# Patient Record
Sex: Female | Born: 2000
Health system: Southern US, Community
[De-identification: ages and names within clinical notes are randomized; demographics above are authoritative.]

## PROBLEM LIST (undated history)

## (undated) DIAGNOSIS — F419 Anxiety disorder, unspecified: Secondary | ICD-10-CM

## (undated) DIAGNOSIS — F909 Attention-deficit hyperactivity disorder, unspecified type: Secondary | ICD-10-CM

## (undated) DIAGNOSIS — F32A Depression, unspecified: Secondary | ICD-10-CM

## (undated) DIAGNOSIS — F329 Major depressive disorder, single episode, unspecified: Secondary | ICD-10-CM

## (undated) HISTORY — DX: Attention-deficit hyperactivity disorder, unspecified type: F90.9

## (undated) HISTORY — DX: Anxiety disorder, unspecified: F41.9

## (undated) HISTORY — DX: Major depressive disorder, single episode, unspecified: F32.9

## (undated) HISTORY — DX: Depression, unspecified: F32.A

---
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
## 2020-01-24 DIAGNOSIS — F411 Generalized anxiety disorder: Secondary | ICD-10-CM | POA: Diagnosis not present

## 2020-01-24 DIAGNOSIS — R69 Illness, unspecified: Secondary | ICD-10-CM | POA: Diagnosis not present

## 2020-01-24 DIAGNOSIS — F5105 Insomnia due to other mental disorder: Secondary | ICD-10-CM | POA: Diagnosis not present

## 2020-01-24 DIAGNOSIS — F429 Obsessive-compulsive disorder, unspecified: Secondary | ICD-10-CM | POA: Diagnosis not present

## 2020-01-25 DIAGNOSIS — I471 Supraventricular tachycardia: Secondary | ICD-10-CM | POA: Diagnosis not present

## 2020-01-25 DIAGNOSIS — I470 Re-entry ventricular arrhythmia: Secondary | ICD-10-CM | POA: Diagnosis not present

## 2020-01-25 DIAGNOSIS — I493 Ventricular premature depolarization: Secondary | ICD-10-CM | POA: Diagnosis not present

## 2020-01-25 DIAGNOSIS — I472 Ventricular tachycardia: Secondary | ICD-10-CM | POA: Diagnosis not present

## 2020-01-25 DIAGNOSIS — Q248 Other specified congenital malformations of heart: Secondary | ICD-10-CM | POA: Diagnosis not present

## 2020-02-06 DIAGNOSIS — I470 Re-entry ventricular arrhythmia: Secondary | ICD-10-CM | POA: Diagnosis not present

## 2020-02-12 DIAGNOSIS — I471 Supraventricular tachycardia: Secondary | ICD-10-CM | POA: Diagnosis not present

## 2020-02-12 DIAGNOSIS — I472 Ventricular tachycardia: Secondary | ICD-10-CM | POA: Diagnosis not present

## 2020-02-12 DIAGNOSIS — I470 Re-entry ventricular arrhythmia: Secondary | ICD-10-CM | POA: Diagnosis not present

## 2020-04-02 DIAGNOSIS — F411 Generalized anxiety disorder: Secondary | ICD-10-CM | POA: Diagnosis not present

## 2020-04-02 DIAGNOSIS — F429 Obsessive-compulsive disorder, unspecified: Secondary | ICD-10-CM | POA: Diagnosis not present

## 2020-04-02 DIAGNOSIS — F5105 Insomnia due to other mental disorder: Secondary | ICD-10-CM | POA: Diagnosis not present

## 2020-04-02 DIAGNOSIS — R69 Illness, unspecified: Secondary | ICD-10-CM | POA: Diagnosis not present

## 2020-04-09 DIAGNOSIS — Q248 Other specified congenital malformations of heart: Secondary | ICD-10-CM | POA: Diagnosis not present

## 2020-04-12 DIAGNOSIS — J029 Acute pharyngitis, unspecified: Secondary | ICD-10-CM | POA: Diagnosis not present

## 2020-04-16 DIAGNOSIS — R002 Palpitations: Secondary | ICD-10-CM | POA: Diagnosis not present

## 2020-04-16 DIAGNOSIS — I059 Rheumatic mitral valve disease, unspecified: Secondary | ICD-10-CM | POA: Diagnosis not present

## 2020-04-16 DIAGNOSIS — I493 Ventricular premature depolarization: Secondary | ICD-10-CM | POA: Diagnosis not present

## 2020-04-16 DIAGNOSIS — Q248 Other specified congenital malformations of heart: Secondary | ICD-10-CM | POA: Diagnosis not present

## 2020-04-16 DIAGNOSIS — I471 Supraventricular tachycardia: Secondary | ICD-10-CM | POA: Diagnosis not present

## 2020-04-16 DIAGNOSIS — R69 Illness, unspecified: Secondary | ICD-10-CM | POA: Diagnosis not present

## 2020-04-16 DIAGNOSIS — I499 Cardiac arrhythmia, unspecified: Secondary | ICD-10-CM | POA: Diagnosis not present

## 2020-04-16 DIAGNOSIS — I491 Atrial premature depolarization: Secondary | ICD-10-CM | POA: Diagnosis not present

## 2020-04-16 DIAGNOSIS — I472 Ventricular tachycardia: Secondary | ICD-10-CM | POA: Diagnosis not present

## 2020-04-17 DIAGNOSIS — J029 Acute pharyngitis, unspecified: Secondary | ICD-10-CM | POA: Diagnosis not present

## 2020-04-17 DIAGNOSIS — Z113 Encounter for screening for infections with a predominantly sexual mode of transmission: Secondary | ICD-10-CM | POA: Diagnosis not present

## 2020-05-26 DIAGNOSIS — J029 Acute pharyngitis, unspecified: Secondary | ICD-10-CM | POA: Diagnosis not present

## 2020-05-28 ENCOUNTER — Ambulatory Visit: Payer: Self-pay

## 2020-05-30 DIAGNOSIS — F411 Generalized anxiety disorder: Secondary | ICD-10-CM | POA: Diagnosis not present

## 2020-05-30 DIAGNOSIS — R69 Illness, unspecified: Secondary | ICD-10-CM | POA: Diagnosis not present

## 2020-05-30 DIAGNOSIS — F5105 Insomnia due to other mental disorder: Secondary | ICD-10-CM | POA: Diagnosis not present

## 2020-05-30 DIAGNOSIS — F429 Obsessive-compulsive disorder, unspecified: Secondary | ICD-10-CM | POA: Diagnosis not present

## 2020-06-15 ENCOUNTER — Encounter: Payer: Self-pay | Admitting: Emergency Medicine

## 2020-06-15 ENCOUNTER — Other Ambulatory Visit: Payer: Self-pay

## 2020-06-15 ENCOUNTER — Emergency Department
Admission: EM | Admit: 2020-06-15 | Discharge: 2020-06-15 | Disposition: A | Discharge status: Home / Self Care | Payer: 59 | Attending: Emergency Medicine | Admitting: Emergency Medicine

## 2020-06-15 DIAGNOSIS — R638 Other symptoms and signs concerning food and fluid intake: Secondary | ICD-10-CM | POA: Diagnosis not present

## 2020-06-15 DIAGNOSIS — Z3202 Encounter for pregnancy test, result negative: Secondary | ICD-10-CM | POA: Diagnosis not present

## 2020-06-15 DIAGNOSIS — R110 Nausea: Secondary | ICD-10-CM | POA: Diagnosis not present

## 2020-06-15 DIAGNOSIS — Z3200 Encounter for pregnancy test, result unknown: Secondary | ICD-10-CM

## 2020-06-15 LAB — POC URINE PREG, ED: Preg Test, Ur: NEGATIVE

## 2020-06-15 LAB — HCG, QUANTITATIVE, PREGNANCY: hCG, Beta Chain, Quant, S: <1 m[IU]/mL (ref <5)

## 2020-06-15 NOTE — ED Provider Notes (Signed)
[facility] Emergency Department Provider Note  ____________________________________________   Event Date/Time   First MD Initiated Contact with Patient 06/15/20 1020     (approximate)  I have reviewed the triage vital signs and the nursing notes.   HISTORY  Chief Complaint Possible Pregnancy    HPI Sherry Clayton is a 20 y.o. female presents emergency department with concerns of pregnancy.  Patient states she is about 10 days late for her period.   States her breasts are sore, she is nauseated, and she has had increased hunger.  No abdominal pain.  Patient took a home pregnancy test that was negative.  States that she is urinated more than twice this morning.   Past Medical History:  Diagnosis Date  . ADHD (attention deficit hyperactivity disorder)   . Anxiety   . Depression     Patient Active Problem List   Diagnosis Date Noted  . Mood disorder (HCC) 01/19/2019  . GAD (generalized anxiety disorder) 06/30/2018    History reviewed. No pertinent surgical history.  Prior to Admission medications   Medication Sig Start Date End Date Taking? Authorizing Provider  FLUoxetine (PROZAC) 10 MG capsule Take 1 capsule (10 mg total) by mouth daily. Take with Prozac 40 mg capsule to make total daily dose of Prozac 50 mg once a day. 03/09/19   Darcel Smalling, MD  FLUoxetine (PROZAC) 40 MG capsule TAKE 1 CAPSULE(40 MG) BY MOUTH DAILY 05/10/19   Darcel Smalling, MD  hydrOXYzine (ATARAX/VISTARIL) 10 MG tablet Take 1-2 tablets (10-20 mg total) by mouth 3 (three) times daily as needed for anxiety. 03/09/19   Darcel Smalling, MD  QUEtiapine (SEROQUEL) 25 MG tablet Take 2 tablets (50 mg total) by mouth at bedtime. 03/09/19   Darcel Smalling, MD    Allergies Sulfa antibiotics  Family History  Problem Relation Age of Onset  . Anxiety disorder Mother   . Depression Mother     Social History Social History   Tobacco Use  . Smoking status: Never Smoker  .  Smokeless tobacco: Never Used  Vaping Use  . Vaping Use: Never used  Substance Use Topics  . Alcohol use: No  . Drug use: No    Review of Systems  Constitutional: No fever/chills Eyes: No visual changes. ENT: No sore throat. Respiratory: Denies cough Cardiovascular: Denies chest pain Gastrointestinal: Denies abdominal pain Genitourinary: Negative for dysuria. Musculoskeletal: Negative for back pain. Skin: Negative for rash. Psychiatric: no mood changes,     ____________________________________________   PHYSICAL EXAM:  VITAL SIGNS: ED Triage Vitals  Enc Vitals Group     BP 06/15/20 1012 115/69     Pulse Rate 06/15/20 1012 91     Resp 06/15/20 1012 20     Temp 06/15/20 1012 98.3 F (36.8 C)     Temp Source 06/15/20 1012 Oral     SpO2 06/15/20 1012 100 %     Weight 06/15/20 1011 130 lb (59 kg)     Height 06/15/20 1011 5\' 7"  (1.702 m)     Head Circumference --      Peak Flow --      Pain Score 06/15/20 1011 0     Pain Loc --      Pain Edu? --      Excl. in GC? --     Constitutional: Alert and oriented. Well appearing and in no acute distress. Eyes: Conjunctivae are normal.  Head: Atraumatic. Nose: No congestion/rhinnorhea. Mouth/Throat: Mucous membranes are moist.  Neck:  supple no lymphadenopathy noted Cardiovascular: Normal rate, regular rhythm. Heart sounds are normal Respiratory: Normal respiratory effort.  No retractions, lungs c t a  Abd: soft nontender bs normal all 4 quad GU: deferred Musculoskeletal: FROM all extremities, warm and well perfused Neurologic:  Normal speech and language.  Skin:  Skin is warm, dry and intact. No rash noted. Psychiatric: Mood and affect are normal. Speech and behavior are normal.  ____________________________________________   LABS (all labs ordered are listed, but only abnormal results are displayed)  Labs Reviewed  HCG, QUANTITATIVE, PREGNANCY  POC URINE PREG, ED    ____________________________________________   ____________________________________________  RADIOLOGY    ____________________________________________   PROCEDURES  Procedure(s) performed: No  Procedures    ____________________________________________   INITIAL IMPRESSION / ASSESSMENT AND PLAN / ED COURSE  Pertinent labs & imaging results that were available during my care of the patient were reviewed by me and considered in my medical decision making (see chart for details).   Patient is 20 year old female presents emergency department with concerns of pregnancy.  See HPI.  Physical exam shows patient appears stable.  POC pregnancy is negative Beta hCG  Explained to the patient I can call her with results.  Since she is not having any abdominal pain will not need to do further testing.  She is agreeable to this plan.    ----------------------------------------- 11:43 AM on 06/15/2020 -----------------------------------------  Beta-hCG is less than 1.  I did call the patient to notify her of the results.  She is to follow-up with her regular doctor if not improving to 3 days.  Return if worsening.  She discharged stable condition.  Sherry Clayton was evaluated in Emergency Department on 06/15/2020 for the symptoms described in the history of present illness. She was evaluated in the context of the global COVID-19 pandemic, which necessitated consideration that the patient might be at risk for infection with the SARS-CoV-2 virus that causes COVID-19. Institutional protocols and algorithms that pertain to the evaluation of patients at risk for COVID-19 are in a state of rapid change based on information released by regulatory bodies including the CDC and federal and state organizations. These policies and algorithms were followed during the patient's care in the ED.    As part of my medical decision making, I reviewed the following data within the electronic MEDICAL RECORD NUMBER  Nursing notes reviewed and incorporated, Labs reviewed , Old chart reviewed, Notes from prior ED visits and [place] Controlled Substance Database  ____________________________________________   FINAL CLINICAL IMPRESSION(S) / ED DIAGNOSES  Final diagnoses:  Encounter for pregnancy test, result unknown  Negative pregnancy test      NEW MEDICATIONS STARTED DURING THIS VISIT:  Discharge Medication List as of 06/15/2020 10:46 AM       Note:  This document was prepared using Dragon voice recognition software and may include unintentional dictation errors.    Faythe Ghee, PA-C 06/15/20 1143    Minna Antis, MD 06/15/20 [phone]

## 2020-06-15 NOTE — ED Notes (Signed)
Pt concerned that she is pregnant. Pt took home preg test yesterday that was negative, but states her period is a few days late. Pt is on birth control. Pt states having some nausea and increased hunger and minor breast tenderness.

## 2020-06-15 NOTE — ED Triage Notes (Signed)
Pt reports wants a pregnancy test

## 2020-07-15 DIAGNOSIS — R69 Illness, unspecified: Secondary | ICD-10-CM | POA: Diagnosis not present

## 2020-07-22 DIAGNOSIS — R69 Illness, unspecified: Secondary | ICD-10-CM | POA: Diagnosis not present

## 2020-07-29 DIAGNOSIS — R69 Illness, unspecified: Secondary | ICD-10-CM | POA: Diagnosis not present

## 2020-07-29 DIAGNOSIS — F5105 Insomnia due to other mental disorder: Secondary | ICD-10-CM | POA: Diagnosis not present

## 2020-07-29 DIAGNOSIS — F411 Generalized anxiety disorder: Secondary | ICD-10-CM | POA: Diagnosis not present

## 2020-07-29 DIAGNOSIS — F429 Obsessive-compulsive disorder, unspecified: Secondary | ICD-10-CM | POA: Diagnosis not present

## 2020-08-05 DIAGNOSIS — R69 Illness, unspecified: Secondary | ICD-10-CM | POA: Diagnosis not present

## 2020-08-14 DIAGNOSIS — L03114 Cellulitis of left upper limb: Secondary | ICD-10-CM | POA: Diagnosis not present

## 2020-08-14 DIAGNOSIS — R69 Illness, unspecified: Secondary | ICD-10-CM | POA: Diagnosis not present

## 2020-08-19 DIAGNOSIS — F411 Generalized anxiety disorder: Secondary | ICD-10-CM | POA: Diagnosis not present

## 2020-08-19 DIAGNOSIS — R69 Illness, unspecified: Secondary | ICD-10-CM | POA: Diagnosis not present

## 2020-08-19 DIAGNOSIS — F5105 Insomnia due to other mental disorder: Secondary | ICD-10-CM | POA: Diagnosis not present

## 2020-08-19 DIAGNOSIS — F429 Obsessive-compulsive disorder, unspecified: Secondary | ICD-10-CM | POA: Diagnosis not present

## 2020-08-24 DIAGNOSIS — R300 Dysuria: Secondary | ICD-10-CM | POA: Diagnosis not present

## 2020-08-24 DIAGNOSIS — R1084 Generalized abdominal pain: Secondary | ICD-10-CM | POA: Diagnosis not present

## 2020-08-24 DIAGNOSIS — R69 Illness, unspecified: Secondary | ICD-10-CM | POA: Diagnosis not present

## 2020-08-29 DIAGNOSIS — R69 Illness, unspecified: Secondary | ICD-10-CM | POA: Diagnosis not present

## 2020-08-30 ENCOUNTER — Encounter: Payer: Self-pay | Admitting: Physician Assistant

## 2020-08-30 ENCOUNTER — Other Ambulatory Visit: Payer: Self-pay

## 2020-08-30 ENCOUNTER — Ambulatory Visit: Payer: 59 | Admitting: Physician Assistant

## 2020-08-30 DIAGNOSIS — Z113 Encounter for screening for infections with a predominantly sexual mode of transmission: Secondary | ICD-10-CM

## 2020-08-30 NOTE — Progress Notes (Signed)
Patient into clinic for STD IS.  Patient is currently taking antibiotic for an UTI and has 5 more days left on her course.  Counseled patient that there is a possibility that due to her current antibiotic use her test results for GC,Chlamydia and Syphilis may not be accurate.  Offered patient option of proceeding with visit and testing today.  Patient opted to not proceed with her visit today and stated that she will RTC after completing her antibiotic for testing.

## 2020-09-17 DIAGNOSIS — F5105 Insomnia due to other mental disorder: Secondary | ICD-10-CM | POA: Diagnosis not present

## 2020-09-17 DIAGNOSIS — F411 Generalized anxiety disorder: Secondary | ICD-10-CM | POA: Diagnosis not present

## 2020-09-17 DIAGNOSIS — F429 Obsessive-compulsive disorder, unspecified: Secondary | ICD-10-CM | POA: Diagnosis not present

## 2020-09-17 DIAGNOSIS — R69 Illness, unspecified: Secondary | ICD-10-CM | POA: Diagnosis not present

## 2020-09-19 DIAGNOSIS — R69 Illness, unspecified: Secondary | ICD-10-CM | POA: Diagnosis not present

## 2020-09-23 DIAGNOSIS — R69 Illness, unspecified: Secondary | ICD-10-CM | POA: Diagnosis not present

## 2020-09-30 DIAGNOSIS — R69 Illness, unspecified: Secondary | ICD-10-CM | POA: Diagnosis not present

## 2020-10-03 ENCOUNTER — Ambulatory Visit: Payer: Self-pay | Admitting: Physician Assistant

## 2020-10-03 ENCOUNTER — Encounter: Payer: Self-pay | Admitting: Physician Assistant

## 2020-10-03 ENCOUNTER — Other Ambulatory Visit: Payer: Self-pay

## 2020-10-03 DIAGNOSIS — R21 Rash and other nonspecific skin eruption: Secondary | ICD-10-CM

## 2020-10-03 MED ORDER — HYDROXYZINE PAMOATE 25 MG PO CAPS: 25.0000 mg | ORAL_CAPSULE | Freq: Three times a day (TID) | ORAL | 0 refills | Status: DC | PRN

## 2020-10-03 MED ORDER — HYDROCORTISONE VALERATE 0.2 % EX CREA: 1.0000 "application " | TOPICAL_CREAM | Freq: Two times a day (BID) | CUTANEOUS | 0 refills | Status: DC

## 2020-10-03 MED ORDER — METHYLPREDNISOLONE 4 MG PO TBPK: ORAL_TABLET | ORAL | 0 refills | Status: DC

## 2020-10-03 NOTE — Progress Notes (Signed)
Pt has rash/red bumps on both legs started Monday. Spreading to arms. Pt describes this very itchy. Denies being in woods and not allergic to anything. CL,RMA

## 2020-10-03 NOTE — Progress Notes (Signed)
   Subjective: Rash    Patient ID: Sherry Clayton, female    DOB: 2000-08-09, 20 y.o.   MRN: [id number]  HPI Patient presents for rash to bilateral lower extremity for 3 days.  Patient denies contact via outdoor activities, patient denies new personal hygiene or laundry products.  Patient's itching associated with rash.  No palliative measure for complaint.  Patient also requests STD testing.  Patient denies symptoms.  Last sexual contact 6 days ago.   Review of Systems Anxiety    Objective:   Physical Exam  No acute distress.  Multiple papular lesion bilateral lower extremity in different stages of healing.  The foot is spared.  The thighs are spared.  The area seem to be concentrated mostly where the patient shaves.  Genitourinary system deferred.      Assessment & Plan: Rash   Patient advised decrease shaving area for 7 to 10 days.  Patient given Solu-Medrol 80 mg IM followed by a prescription of Medrol Dosepak, hydrocortisone, and Atarax.  Patient will follow-up in 1 week.

## 2020-10-04 ENCOUNTER — Other Ambulatory Visit: Payer: Self-pay

## 2020-10-04 ENCOUNTER — Ambulatory Visit: Payer: 59 | Admitting: Advanced Practice Midwife

## 2020-10-04 DIAGNOSIS — Z0184 Encounter for antibody response examination: Secondary | ICD-10-CM

## 2020-10-04 NOTE — Progress Notes (Signed)
Pt presents today for labs from yesterdays appointment with ron smith,pa. Pt declined blood work after requesting due to getting dizzy after steroid injections in the hip. Pt called back today to receive her labs. CL,RMA

## 2020-10-07 LAB — HIV ANTIBODY (ROUTINE TESTING W REFLEX): HIV Screen 4th Generation wRfx: NONREACTIVE

## 2020-10-07 LAB — VARICELLA ZOSTER ANTIBODY, IGG: Varicella zoster IgG: <135 index — ABNORMAL LOW (ref >165)

## 2020-10-07 MED ORDER — METHYLPREDNISOLONE SODIUM SUCC 40 MG IJ SOLR
80.0000 mg | Freq: Once | INTRAMUSCULAR | Status: AC
Start: 2020-10-07 — End: 2020-10-03
  Administered 2020-10-03: 80 mg via INTRAMUSCULAR

## 2020-10-07 NOTE — Addendum Note (Signed)
Addended by: Christianne Dolin F on: 10/07/2020 09:13 AM   Modules accepted: Orders

## 2020-10-16 DIAGNOSIS — R69 Illness, unspecified: Secondary | ICD-10-CM | POA: Diagnosis not present

## 2020-10-18 DIAGNOSIS — Z709 Sex counseling, unspecified: Secondary | ICD-10-CM | POA: Diagnosis not present

## 2020-10-18 DIAGNOSIS — R69 Illness, unspecified: Secondary | ICD-10-CM | POA: Diagnosis not present

## 2020-10-22 DIAGNOSIS — R69 Illness, unspecified: Secondary | ICD-10-CM | POA: Diagnosis not present

## 2020-10-23 DIAGNOSIS — R69 Illness, unspecified: Secondary | ICD-10-CM | POA: Diagnosis not present

## 2020-10-28 ENCOUNTER — Other Ambulatory Visit: Payer: Self-pay

## 2020-10-28 ENCOUNTER — Encounter: Payer: Self-pay | Admitting: Physician Assistant

## 2020-10-28 ENCOUNTER — Ambulatory Visit: Payer: Self-pay | Admitting: Physician Assistant

## 2020-10-28 VITALS — BP 108/69 | HR 92 | Temp 98.4°F | Resp 12 | Ht 67.0 in | Wt 122.0 lb

## 2020-10-28 DIAGNOSIS — R21 Rash and other nonspecific skin eruption: Secondary | ICD-10-CM

## 2020-10-28 NOTE — Progress Notes (Signed)
Pt presents today with rash on both legs. Pt states most of it went a way when treated now its coming back. CL,RMA

## 2020-10-28 NOTE — Progress Notes (Signed)
   Subjective:Rash    Patient ID: Sherry Clayton, female    DOB: 2001-02-27, 20 y.o.   MRN: [id number]  HPI Patient to follow-up with 3 weeks secondary to nonspecific rash to bilateral lower extremities.  Patient was given prescription for Medrol Dosepak and Atarax.  Patient states the rash improved but started coming back status post cessation of medications.  Patient also has restarted shaving area but stated the rash came before she started shaving.  States mild itching but no drainage.   Review of Systems Negative except for complaint    Objective:   Physical Exam No acute distress.  Papular lesion bilateral lower extremity.  No signs symptoms of excoriation or secondary infection.      Assessment & Plan: Nonspecific rash   Differentials consist of contact dermatitis versus irritation from shaving.  Definitive evaluation by dermatology is warranted.  Consult will be generated patient with follow-up with dermatology.

## 2020-10-31 ENCOUNTER — Ambulatory Visit (INDEPENDENT_AMBULATORY_CARE_PROVIDER_SITE_OTHER): Payer: 59 | Admitting: Obstetrics

## 2020-10-31 ENCOUNTER — Other Ambulatory Visit: Payer: Self-pay

## 2020-10-31 ENCOUNTER — Encounter: Payer: Self-pay | Admitting: Obstetrics

## 2020-10-31 ENCOUNTER — Other Ambulatory Visit (HOSPITAL_COMMUNITY)
Admission: RE | Admit: 2020-10-31 | Discharge: 2020-10-31 | Disposition: A | Discharge status: Home / Self Care | Payer: 59 | Source: Ambulatory Visit | Attending: Obstetrics | Admitting: Obstetrics

## 2020-10-31 VITALS — BP 120/80 | Ht 67.0 in | Wt 125.0 lb

## 2020-10-31 DIAGNOSIS — R69 Illness, unspecified: Secondary | ICD-10-CM | POA: Diagnosis not present

## 2020-10-31 DIAGNOSIS — N898 Other specified noninflammatory disorders of vagina: Secondary | ICD-10-CM | POA: Diagnosis not present

## 2020-10-31 DIAGNOSIS — Z113 Encounter for screening for infections with a predominantly sexual mode of transmission: Secondary | ICD-10-CM

## 2020-10-31 NOTE — Progress Notes (Signed)
Obstetrics & Gynecology Office Visit   Chief Complaint:  Chief Complaint  Patient presents with   Vaginitis    History of Present Illness: Sherry Clayton is a 20 year old patient, new to the practice, who presents for c/o a vaginal odor that she has noticed for about two weeks. She was seen last week at her pediatrics office and shares that she was screened for STIs, and "everything was negative". She had both vaginal swab and blood work at that office. Today she reports with the same complaint of a vaginal discharge that has an odor. She is sexually active, and uses condoms for birth control. She admits to greater than 10 sexual partners over the last year; two partners in the last month. She is not interested in using other contraceptives.   Review of Systems:  Review of Systems  Constitutional: Negative.   HENT: Negative.    Eyes: Negative.   Respiratory: Negative.    Cardiovascular: Negative.   Gastrointestinal: Negative.   Genitourinary: Negative.   Musculoskeletal: Negative.   Skin: Negative.   Neurological: Negative.   Endo/Heme/Allergies: Negative.   Psychiatric/Behavioral:  The patient is nervous/anxious.        Hx of anxiety    Past Medical History:  Past Medical History:  Diagnosis Date   ADHD (attention deficit hyperactivity disorder)    Anxiety    Depression     Past Surgical History:  History reviewed. No pertinent surgical history.  Gynecologic History: Patient's last menstrual period was 10/14/2020.  Obstetric History: No obstetric history on file.  Family History:  Family History  Problem Relation Age of Onset   Anxiety disorder Mother    Depression Mother     Social History:  Social History   Socioeconomic History   Marital status: Single    Spouse name: Not on file   Number of children: Not on file   Years of education: Not on file   Highest education level: Not on file  Occupational History   Not on file  Tobacco Use   Smoking status: Never    Smokeless tobacco: Never  Vaping Use   Vaping Use: Never used  Substance and Sexual Activity   Alcohol use: No   Drug use: No   Sexual activity: Yes  Other Topics Concern   Not on file  Social History Narrative   Not on file   Social Determinants of Health   Financial Resource Strain: Not on file  Food Insecurity: Not on file  Transportation Needs: Not on file  Physical Activity: Not on file  Stress: Not on file  Social Connections: Not on file  Intimate Partner Violence: Not on file    Allergies:  Allergies  Allergen Reactions   Sulfa Antibiotics Rash    Medications: Prior to Admission medications   Medication Sig Start Date End Date Taking? Authorizing Provider  FLUoxetine (PROZAC) 20 MG capsule Take 20 mg by mouth daily. 09/23/20   [provider]  hydrocortisone valerate cream (WESTCORT) 0.2 % Apply 1 application topically 2 (two) times daily. 10/03/20   Joni Reining, PA-C  hydrOXYzine (VISTARIL) 25 MG capsule Take 1 capsule (25 mg total) by mouth every 8 (eight) hours as needed. 10/03/20   Joni Reining, PA-C  lamoTRIgine (LAMICTAL) 150 MG tablet  11/19/19   [provider]  QUEtiapine (SEROQUEL) 25 MG tablet Take 2 tablets (50 mg total) by mouth at bedtime. 03/09/19   Darcel Smalling, MD    Physical Exam  Vitals:  Vitals:   10/31/20 1337  BP: 120/80   Patient's last menstrual period was 10/14/2020.  Physical Exam Vitals reviewed.  Constitutional:      Appearance: Normal appearance. She is normal weight.  HENT:     Head: Normocephalic and atraumatic.     Nose: Nose normal.  Cardiovascular:     Rate and Rhythm: Normal rate and regular rhythm.     Pulses: Normal pulses.     Heart sounds: Normal heart sounds.  Pulmonary:     Effort: Pulmonary effort is normal.     Breath sounds: Normal breath sounds.  Abdominal:     General: Abdomen is flat.     Palpations: Abdomen is soft.  Genitourinary:    General: Normal vulva.     Vagina:  Vaginal discharge present.     Rectum: Normal.     Comments: Shaves entire mons area. No lesions or rashes noted. Normal vaginal rugae. Scant whitish discharge. Normal appearing cervix. Aptima swab retrieved. Wet mount shows moderate WBCs, negative whiff, no trich or yeast seen. Musculoskeletal:        General: Normal range of motion.     Cervical back: Normal range of motion and neck supple.  Skin:    General: Skin is warm and dry.  Neurological:     General: No focal deficit present.     Mental Status: She is alert and oriented to person, place, and time.  Psychiatric:        Mood and Affect: Mood normal.        Behavior: Behavior normal.     Assessment: 20 y.o. No obstetric history on file. Vaginal discharge.   Plan: Problem List Items Addressed This Visit       Other   Vaginal discharge - Primary   Relevant Orders   Cervicovaginal ancillary only   Other Visit Diagnoses     Routine screening for STI (sexually transmitted infection)       Relevant Orders   Cervicovaginal ancillary only     Will contact patient regarding the test results. Discussed risk for STIs with her number of partners. Praised for condom use. Offered OCPs or other contraception- encouraged her to RTC for contraception.  Mirna Mires, CNM  10/31/2020 5:45 PM

## 2020-11-04 LAB — CERVICOVAGINAL ANCILLARY ONLY
Bacterial Vaginitis (gardnerella): POSITIVE — AB
Candida Glabrata: NEGATIVE
Candida Vaginitis: NEGATIVE
Chlamydia: NEGATIVE
Comment: NEGATIVE
Comment: NEGATIVE
Comment: NEGATIVE
Comment: NEGATIVE
Comment: NEGATIVE
Comment: NORMAL
Neisseria Gonorrhea: NEGATIVE
Trichomonas: NEGATIVE

## 2020-11-08 ENCOUNTER — Other Ambulatory Visit: Payer: Self-pay | Admitting: Obstetrics

## 2020-11-08 DIAGNOSIS — B9689 Other specified bacterial agents as the cause of diseases classified elsewhere: Secondary | ICD-10-CM

## 2020-11-08 MED ORDER — METRONIDAZOLE 500 MG PO TABS: 500.0000 mg | ORAL_TABLET | Freq: Two times a day (BID) | ORAL | 0 refills | Status: AC

## 2020-11-08 NOTE — Progress Notes (Signed)
Patient notified of the +BV diagosisi. Rx for Metronidazole sent to pharmacy/ Mirna Mires, CNM  11/08/2020 9:37 AM

## 2020-12-06 DIAGNOSIS — F411 Generalized anxiety disorder: Secondary | ICD-10-CM | POA: Diagnosis not present

## 2020-12-06 DIAGNOSIS — F429 Obsessive-compulsive disorder, unspecified: Secondary | ICD-10-CM | POA: Diagnosis not present

## 2020-12-06 DIAGNOSIS — R69 Illness, unspecified: Secondary | ICD-10-CM | POA: Diagnosis not present

## 2020-12-06 DIAGNOSIS — F5105 Insomnia due to other mental disorder: Secondary | ICD-10-CM | POA: Diagnosis not present

## 2020-12-22 DIAGNOSIS — F429 Obsessive-compulsive disorder, unspecified: Secondary | ICD-10-CM | POA: Diagnosis not present

## 2020-12-22 DIAGNOSIS — R69 Illness, unspecified: Secondary | ICD-10-CM | POA: Diagnosis not present

## 2020-12-22 DIAGNOSIS — F411 Generalized anxiety disorder: Secondary | ICD-10-CM | POA: Diagnosis not present

## 2020-12-22 DIAGNOSIS — F5105 Insomnia due to other mental disorder: Secondary | ICD-10-CM | POA: Diagnosis not present

## 2021-01-07 DIAGNOSIS — R69 Illness, unspecified: Secondary | ICD-10-CM | POA: Diagnosis not present

## 2021-01-14 DIAGNOSIS — F5105 Insomnia due to other mental disorder: Secondary | ICD-10-CM | POA: Diagnosis not present

## 2021-01-14 DIAGNOSIS — F411 Generalized anxiety disorder: Secondary | ICD-10-CM | POA: Diagnosis not present

## 2021-01-14 DIAGNOSIS — F429 Obsessive-compulsive disorder, unspecified: Secondary | ICD-10-CM | POA: Diagnosis not present

## 2021-01-14 DIAGNOSIS — R69 Illness, unspecified: Secondary | ICD-10-CM | POA: Diagnosis not present

## 2021-01-17 DIAGNOSIS — R69 Illness, unspecified: Secondary | ICD-10-CM | POA: Diagnosis not present

## 2021-01-22 DIAGNOSIS — R69 Illness, unspecified: Secondary | ICD-10-CM | POA: Diagnosis not present

## 2021-01-31 DIAGNOSIS — R69 Illness, unspecified: Secondary | ICD-10-CM | POA: Diagnosis not present

## 2021-02-12 ENCOUNTER — Other Ambulatory Visit: Payer: Self-pay | Admitting: Physician Assistant

## 2021-02-12 ENCOUNTER — Encounter: Payer: Self-pay | Admitting: Physician Assistant

## 2021-02-12 ENCOUNTER — Other Ambulatory Visit: Payer: Self-pay

## 2021-02-12 ENCOUNTER — Ambulatory Visit: Payer: Self-pay | Admitting: Physician Assistant

## 2021-02-12 VITALS — BP 104/67 | HR 98 | Temp 98.2°F | Resp 12 | Ht 67.0 in | Wt 130.0 lb

## 2021-02-12 DIAGNOSIS — R399 Unspecified symptoms and signs involving the genitourinary system: Secondary | ICD-10-CM

## 2021-02-12 LAB — POCT URINALYSIS DIPSTICK
Bilirubin, UA: NEGATIVE
Blood, UA: NEGATIVE
Glucose, UA: NEGATIVE
Ketones, UA: NEGATIVE
Leukocytes, UA: NEGATIVE
Nitrite, UA: NEGATIVE
Protein, UA: NEGATIVE
Spec Grav, UA: <=1.005 — AB (ref 1.010–1.025)
Urobilinogen, UA: 0.2 E.U./dL
pH, UA: 6 (ref 5.0–8.0)

## 2021-02-12 MED ORDER — PHENAZOPYRIDINE HCL 200 MG PO TABS
200.0000 mg | ORAL_TABLET | Freq: Three times a day (TID) | ORAL | 0 refills | Status: DC | PRN
Start: 2021-02-12 — End: 2021-11-05

## 2021-02-12 NOTE — Progress Notes (Signed)
For about 2 or three mostly 3 weeks, Pt states she has burning sensation when voiding and slight itching. She says it comes and goes./CL,RMA

## 2021-02-12 NOTE — Progress Notes (Signed)
   Subjective:dysuria    Patient ID: Sherry Clayton, female    DOB: 2000/11/25, 20 y.o.   MRN: [id number]  HPI Patient presents with urinary frequency and dysuria.  Patient has vaginal discharge.  Patient denies fever or flank pain.  Patient is sexually active.   Review of Systems Anxiety, depression, and generalized seizure disorder.    Objective:   Physical Exam Deferred.  No acute findings on urinalysis.       Assessment & Plan:   Patient advised to follow-up with GYN in clinic.  Patient given prescription for Pyridium and advised her color changes have been noticed in the urine.

## 2021-02-13 DIAGNOSIS — M5489 Other dorsalgia: Secondary | ICD-10-CM | POA: Diagnosis not present

## 2021-02-13 DIAGNOSIS — R300 Dysuria: Secondary | ICD-10-CM | POA: Diagnosis not present

## 2021-03-18 DIAGNOSIS — F429 Obsessive-compulsive disorder, unspecified: Secondary | ICD-10-CM | POA: Diagnosis not present

## 2021-03-18 DIAGNOSIS — R69 Illness, unspecified: Secondary | ICD-10-CM | POA: Diagnosis not present

## 2021-03-18 DIAGNOSIS — F411 Generalized anxiety disorder: Secondary | ICD-10-CM | POA: Diagnosis not present

## 2021-03-18 DIAGNOSIS — F5105 Insomnia due to other mental disorder: Secondary | ICD-10-CM | POA: Diagnosis not present

## 2021-04-18 DIAGNOSIS — R69 Illness, unspecified: Secondary | ICD-10-CM | POA: Diagnosis not present

## 2021-04-28 DIAGNOSIS — R69 Illness, unspecified: Secondary | ICD-10-CM | POA: Diagnosis not present

## 2021-05-06 DIAGNOSIS — R69 Illness, unspecified: Secondary | ICD-10-CM | POA: Diagnosis not present

## 2021-05-26 DIAGNOSIS — R69 Illness, unspecified: Secondary | ICD-10-CM | POA: Diagnosis not present

## 2021-06-06 DIAGNOSIS — F5105 Insomnia due to other mental disorder: Secondary | ICD-10-CM | POA: Diagnosis not present

## 2021-06-06 DIAGNOSIS — F429 Obsessive-compulsive disorder, unspecified: Secondary | ICD-10-CM | POA: Diagnosis not present

## 2021-06-06 DIAGNOSIS — F411 Generalized anxiety disorder: Secondary | ICD-10-CM | POA: Diagnosis not present

## 2021-06-06 DIAGNOSIS — R69 Illness, unspecified: Secondary | ICD-10-CM | POA: Diagnosis not present

## 2021-06-16 DIAGNOSIS — R69 Illness, unspecified: Secondary | ICD-10-CM | POA: Diagnosis not present

## 2021-07-07 DIAGNOSIS — R69 Illness, unspecified: Secondary | ICD-10-CM | POA: Diagnosis not present

## 2021-07-22 DIAGNOSIS — N898 Other specified noninflammatory disorders of vagina: Secondary | ICD-10-CM | POA: Diagnosis not present

## 2021-07-28 DIAGNOSIS — R69 Illness, unspecified: Secondary | ICD-10-CM | POA: Diagnosis not present

## 2021-08-19 DIAGNOSIS — R69 Illness, unspecified: Secondary | ICD-10-CM | POA: Diagnosis not present

## 2021-09-02 DIAGNOSIS — F429 Obsessive-compulsive disorder, unspecified: Secondary | ICD-10-CM | POA: Diagnosis not present

## 2021-09-02 DIAGNOSIS — R69 Illness, unspecified: Secondary | ICD-10-CM | POA: Diagnosis not present

## 2021-09-02 DIAGNOSIS — F5105 Insomnia due to other mental disorder: Secondary | ICD-10-CM | POA: Diagnosis not present

## 2021-09-02 DIAGNOSIS — F411 Generalized anxiety disorder: Secondary | ICD-10-CM | POA: Diagnosis not present

## 2021-09-15 DIAGNOSIS — R69 Illness, unspecified: Secondary | ICD-10-CM | POA: Diagnosis not present

## 2021-10-20 DIAGNOSIS — R69 Illness, unspecified: Secondary | ICD-10-CM | POA: Diagnosis not present

## 2021-11-03 DIAGNOSIS — R69 Illness, unspecified: Secondary | ICD-10-CM | POA: Diagnosis not present

## 2021-11-05 ENCOUNTER — Ambulatory Visit: Payer: Self-pay | Admitting: Emergency Medicine

## 2021-11-05 VITALS — BP 123/81 | HR 105 | Temp 98.0°F | Resp 14 | Ht 68.0 in | Wt 147.0 lb

## 2021-11-05 DIAGNOSIS — F411 Generalized anxiety disorder: Secondary | ICD-10-CM

## 2021-11-05 DIAGNOSIS — R69 Illness, unspecified: Secondary | ICD-10-CM | POA: Diagnosis not present

## 2021-11-05 DIAGNOSIS — R002 Palpitations: Secondary | ICD-10-CM

## 2021-11-05 DIAGNOSIS — F429 Obsessive-compulsive disorder, unspecified: Secondary | ICD-10-CM | POA: Diagnosis not present

## 2021-11-05 DIAGNOSIS — F5105 Insomnia due to other mental disorder: Secondary | ICD-10-CM | POA: Diagnosis not present

## 2021-11-05 NOTE — Progress Notes (Signed)
  Subjective:     Patient ID: Sherry Clayton, female   DOB: Oct 04, 2000, 20 y.o.   MRN: [id number]  HPI Here with complaint of possible dips in her blood sugar, increased anxiety and some palpations.   Patient states that she is having difficulty sleeping some nights.  She continues to take her regular medication and also has an appointment with Dr. Maryruth Bun this afternoon at 3:30pm.  Patient has history of mood disorder, bipolar and anxiety.  Patient reports that she is still continuing to take medication prescribed by her psychiatrist.  She was being followed by cardiology at [facility] with her last visit being 04/16/2020.  She reports that she currently is trying to get established with a cardiologist with [facility] as [facility] is no longer in her network.  She denies any chest pain or difficulty breathing.  She reports that in the mornings she does not eat anything and only eats around 12 noon or 1 PM.  She states that eating sugar sometimes may make her feel better.  Patient admits to occasional alcohol use but denies the use of recreational drugs.  She has decreased and has almost discontinued vaping completely as she realized this could be a part of her anxiety.  Review of Systems No cough, fever, chills, vomiting or diarrhea.  No syncopal episodes.    Objective:   Physical Exam Patient is able to talk in complete sentences without any shortness of breath or difficulty.  Very active and animated. Heart regular rhythm and a rate of 97. Lungs are clear bilaterally.    Assessment:     Anxiety. Reported insomnia Bipolar disorder currently being seen by psychiatry.     Plan:     In looking back through patient's chart she has not had any lab work done in this office and she did is not aware of any blood work being done in any office in the last 2 years.  I suggested that we do an executive panel fasting tomorrow with a TSH and A1c.  Patient is willing and understands that this will be needed to further evaluate  some of her complaints.  She is strongly encouraged to keep her appointment with her psychiatrist this afternoon.  EKG in the office today shows normal sinus rhythm with a ventricular rate of 97.  Compared with last EKG from [facility] 04/16/20.  She is to continue taking medication as prescribed by her psychiatrist until she is advised otherwise.

## 2021-11-05 NOTE — Progress Notes (Signed)
Concerned of having low sugar causing anxiety to be worse or having panic attacks more often.

## 2021-11-06 ENCOUNTER — Other Ambulatory Visit: Payer: Self-pay

## 2021-11-06 DIAGNOSIS — Z0000 Encounter for general adult medical examination without abnormal findings: Secondary | ICD-10-CM

## 2021-11-06 LAB — POCT URINALYSIS DIPSTICK
Bilirubin, UA: NEGATIVE
Blood, UA: NEGATIVE
Glucose, UA: NEGATIVE
Ketones, UA: NEGATIVE
Leukocytes, UA: NEGATIVE
Nitrite, UA: NEGATIVE
Protein, UA: NEGATIVE
Spec Grav, UA: 1.025 (ref 1.010–1.025)
Urobilinogen, UA: 0.2 E.U./dL
pH, UA: 6 (ref 5.0–8.0)

## 2021-11-07 LAB — CMP12+LP+TP+TSH+6AC+CBC/D/PLT
ALT: 9 IU/L (ref 0–32)
AST: 14 IU/L (ref 0–40)
Albumin/Globulin Ratio: 1.7 (ref 1.2–2.2)
Albumin: 4.4 g/dL (ref 4.0–5.0)
Alkaline Phosphatase: 88 IU/L (ref 42–106)
BUN/Creatinine Ratio: 16 (ref 9–23)
BUN: 13 mg/dL (ref 6–20)
Basophils Absolute: 0.1 10*3/uL (ref 0.0–0.2)
Basos: 1 %
Bilirubin Total: <0.2 mg/dL (ref 0.0–1.2)
Calcium: 9.1 mg/dL (ref 8.7–10.2)
Chloride: 105 mmol/L (ref 96–106)
Chol/HDL Ratio: 3 ratio (ref 0.0–4.4)
Cholesterol, Total: 161 mg/dL (ref 100–199)
Creatinine, Ser: 0.79 mg/dL (ref 0.57–1.00)
EOS (ABSOLUTE): 0.1 10*3/uL (ref 0.0–0.4)
Eos: 2 %
Estimated CHD Risk: <0.5 times avg. (ref 0.0–1.0)
Free Thyroxine Index: 2.2 (ref 1.2–4.9)
GGT: 8 IU/L (ref 0–60)
Globulin, Total: 2.6 g/dL (ref 1.5–4.5)
Glucose: 90 mg/dL (ref 70–99)
HDL: 54 mg/dL (ref >39)
Hematocrit: 37.5 % (ref 34.0–46.6)
Hemoglobin: 12.8 g/dL (ref 11.1–15.9)
Immature Grans (Abs): 0 10*3/uL (ref 0.0–0.1)
Immature Granulocytes: 0 %
Iron: 47 ug/dL (ref 27–159)
LDH: 132 IU/L (ref 119–226)
LDL Chol Calc (NIH): 90 mg/dL (ref 0–99)
Lymphocytes Absolute: 3.1 10*3/uL (ref 0.7–3.1)
Lymphs: 44 %
MCH: 30.3 pg (ref 26.6–33.0)
MCHC: 34.1 g/dL (ref 31.5–35.7)
MCV: 89 fL (ref 79–97)
Monocytes Absolute: 0.7 10*3/uL (ref 0.1–0.9)
Monocytes: 10 %
Neutrophils Absolute: 3 10*3/uL (ref 1.4–7.0)
Neutrophils: 43 %
Phosphorus: 3.3 mg/dL (ref 3.0–4.3)
Platelets: 237 10*3/uL (ref 150–450)
Potassium: 3.7 mmol/L (ref 3.5–5.2)
RBC: 4.23 x10E6/uL (ref 3.77–5.28)
RDW: 12.2 % (ref 11.7–15.4)
Sodium: 141 mmol/L (ref 134–144)
T3 Uptake Ratio: 30 % (ref 24–39)
T4, Total: 7.3 ug/dL (ref 4.5–12.0)
TSH: 2.6 u[IU]/mL (ref 0.450–4.500)
Total Protein: 7 g/dL (ref 6.0–8.5)
Triglycerides: 91 mg/dL (ref 0–149)
Uric Acid: 3.3 mg/dL (ref 2.6–6.2)
VLDL Cholesterol Cal: 17 mg/dL (ref 5–40)
WBC: 7.1 10*3/uL (ref 3.4–10.8)
eGFR: 110 mL/min/{1.73_m2} (ref >59)

## 2021-11-11 ENCOUNTER — Encounter: Payer: 59 | Admitting: Physician Assistant

## 2021-11-12 ENCOUNTER — Ambulatory Visit: Payer: Self-pay | Admitting: Physician Assistant

## 2021-11-12 ENCOUNTER — Encounter: Payer: Self-pay | Admitting: Physician Assistant

## 2021-11-12 VITALS — BP 109/76 | HR 80 | Temp 98.0°F | Resp 12 | Ht 68.0 in | Wt 147.0 lb

## 2021-11-12 DIAGNOSIS — Z0000 Encounter for general adult medical examination without abnormal findings: Secondary | ICD-10-CM

## 2021-11-12 NOTE — Progress Notes (Signed)
Pt presents today to complete physical. Pt denies any issues or concerns at this time./CL,RMA 

## 2021-11-12 NOTE — Progress Notes (Signed)
[facility]   ____________________________________________   None    (approximate)  I have reviewed the triage vital signs and the nursing notes.   HISTORY  Chief Complaint Annual Exam  HPI Sherry Clayton is a 21 y.o. female patient presents for annual physical exam.  Patient voiced no concerns or complaints.         Past Medical History:  Diagnosis Date   ADHD (attention deficit hyperactivity disorder)    Anxiety    Depression     Patient Active Problem List   Diagnosis Date Noted   Vaginal discharge 10/31/2020   Mood disorder ([place]) 01/19/2019   GAD (generalized anxiety disorder) 06/30/2018    History reviewed. No pertinent surgical history.  Prior to Admission medications   Medication Sig Start Date End Date Taking? Authorizing Provider  FLUoxetine (PROZAC) 20 MG capsule Take 20 mg by mouth daily. 09/23/20  Yes [provider]  hydrOXYzine (VISTARIL) 25 MG capsule Take 1 capsule (25 mg total) by mouth every 8 (eight) hours as needed. 10/03/20  Yes Sable Feil, PA-C  lamoTRIgine (LAMICTAL) 150 MG tablet  11/19/19  Yes [provider]  QUEtiapine (SEROQUEL) 50 MG tablet Take 50 mg by mouth at bedtime. 01/15/21  Yes [provider]    Allergies Sulfa antibiotics  Family History  Problem Relation Age of Onset   Anxiety disorder Mother    Depression Mother     Social History Social History   Tobacco Use   Smoking status: Never   Smokeless tobacco: Never  Vaping Use   Vaping Use: Never used  Substance Use Topics   Alcohol use: No   Drug use: No    Review of Systems Constitutional: No fever/chills Eyes: No visual changes. ENT: No sore throat. Cardiovascular: Denies chest pain. Respiratory: Denies shortness of breath. Gastrointestinal: No abdominal pain.  No nausea, no vomiting.  No diarrhea.  No constipation. Genitourinary: Negative for dysuria. Musculoskeletal: Negative for back  pain. Skin: Negative for rash. Neurological: Negative for headaches, focal weakness or numbness. Psychiatric: ADHD, anxiety, and depression. Allergic/Immunilogical: Sulfa antibiotics ____________________________________________   PHYSICAL EXAM:  VITAL SIGNS: BP is 109/76, pulse 80, respiration 12, temperature 98, and patient is 97% O2 sat on room air.  Patient weighs on 47 pounds and BMI is 22.35. Constitutional: Alert and oriented. Well appearing and in no acute distress. Eyes: Conjunctivae are normal. PERRL. EOMI. Head: Atraumatic. Nose: No congestion/rhinnorhea. Mouth/Throat: Mucous membranes are moist.  Oropharynx non-erythematous. Neck: No stridor.  No cervical spine tenderness to palpation Hematological/Lymphatic/Immunilogical: No cervical lymphadenopathy. Cardiovascular: Normal rate, regular rhythm. Grossly normal heart sounds.  Good peripheral circulation. Respiratory: Normal respiratory effort.  No retractions. Lungs CTAB. Gastrointestinal: Soft and nontender. No distention. No abdominal bruits. No CVA tenderness. Genitourinary: Deferred Musculoskeletal: No lower extremity tenderness nor edema.  No joint effusions. Neurologic:  Normal speech and language. No gross focal neurologic deficits are appreciated. No gait instability. Skin:  Skin is warm, dry and intact. No rash noted. Psychiatric: Mood and affect are normal. Speech and behavior are normal.  ____________________________________________   LABS _____       Component Ref Range & Units 6 d ago 9 mo ago 3 yr ago  Color, UA  Light Yellow  yellow    Clarity, UA  Clear  clear    Glucose, UA Negative Negative  Negative    Bilirubin, UA  Negative  negative    Ketones, UA  Negative  negative    Spec  Grav, UA 1.010 - 1.025 1.025  <=1.005 Abnormal     Blood, UA  Negative  negative    pH, UA 5.0 - 8.0 6.0  6.0    Protein, UA Negative Negative  Negative    Urobilinogen, UA 0.2 or 1.0 E.U./dL 0.2  0.2    Nitrite, UA   Negative  negative    Leukocytes, UA Negative Negative  Negative  NEGATIVE R   Appearance  Clear  light  CLEAR Abnormal  R   Odor       Resulting Agency    [facility]                  Other Results from 11/06/2021  CMP12+LP+TP+TSH+6AC+CBC/D/Plt Order: [id number] Status: Final result    Visible to patient: Yes (not seen)    Next appt: None    Dx: Routine adult health maintenance    0 Result Notes           Component Ref Range & Units 6 d ago (11/06/21) 2 yr ago (01/03/19) 2 yr ago (01/03/19) 2 yr ago (01/03/19) 2 yr ago (01/03/19) 3 yr ago (04/12/18) 3 yr ago (04/12/18)  Glucose 70 - 99 mg/dL 90    86 R   110 High     Uric Acid 2.6 - 6.2 mg/dL 3.3         Comment:            Therapeutic target for gout patients: <6.0  BUN 6 - 20 mg/dL 13    7   13  R    Creatinine, Ser 0.57 - 1.00 mg/dL 0.79    0.69   0.84 R    eGFR >59 mL/min/1.73 110         BUN/Creatinine Ratio 9 - 23 16    10       Sodium 134 - 144 mmol/L 141    139   137 R    Potassium 3.5 - 5.2 mmol/L 3.7    4.1   3.4 Low  R    Chloride 96 - 106 mmol/L 105    106   102 R    Calcium 8.7 - 10.2 mg/dL 9.1    8.8   9.4 R    Phosphorus 3.0 - 4.3 mg/dL 3.3         Total Protein 6.0 - 8.5 g/dL 7.0    6.6   7.4 R    Albumin 4.0 - 5.0 g/dL 4.4    4.2 R   4.7 R    Globulin, Total 1.5 - 4.5 g/dL 2.6    2.4      Albumin/Globulin Ratio 1.2 - 2.2 1.7    1.8      Bilirubin Total 0.0 - 1.2 mg/dL <0.2    0.2   0.7 R    Alkaline Phosphatase 42 - 106 IU/L 88    78 R   79 R    LDH 119 - 226 IU/L 132         AST 0 - 40 IU/L 14    15   21  R    ALT 0 - 32 IU/L 9    11   14  R    GGT 0 - 60 IU/L 8         Iron 27 - 159 ug/dL 47         Cholesterol, Total 100 - 199 mg/dL 161   135 R       Triglycerides  0 - 149 mg/dL 91   56 R       HDL >39 mg/dL 54   43       VLDL Cholesterol Cal 5 - 40 mg/dL 17   12       LDL Chol Calc (NIH) 0 - 99 mg/dL 90   80 R       Chol/HDL Ratio 0.0 - 4.4 ratio 3.0   3.1 CM       Comment:                                    T. Chol/HDL Ratio                                              Men  Women                                1/2 Avg.Risk  3.4    3.3                                    Avg.Risk  5.0    4.4                                 2X Avg.Risk  9.6    7.1                                 3X Avg.Risk 23.4   11.0   Estimated CHD Risk 0.0 - 1.0 times avg.  < 0.5         Comment: The CHD Risk is based on the T. Chol/HDL ratio. Other  factors affect CHD Risk such as hypertension, smoking,  diabetes, severe obesity, and family history of  premature CHD.   TSH 0.450 - 4.500 uIU/mL 2.600  1.670        T4, Total 4.5 - 12.0 ug/dL 7.3         T3 Uptake Ratio 24 - 39 % 30         Free Thyroxine Index 1.2 - 4.9 2.2         WBC 3.4 - 10.8 x10E3/uL 7.1     6.6   7.9 R   RBC 3.77 - 5.28 x10E6/uL 4.23     3.84   4.12 R   Hemoglobin 11.1 - 15.9 g/dL 12.8     12.0   12.4 R   Hematocrit 34.0 - 46.6 % 37.5     35.1   37.7 R   MCV 79 - 97 fL 89     91   91.5 R   MCH 26.6 - 33.0 pg 30.3     31.3   30.1 R   MCHC 31.5 - 35.7 g/dL 34.1     34.2   32.9 R   RDW 11.7 - 15.4 % 12.2     11.8   12.6 R   Platelets 150 - 450 x10E3/uL 237       195 R   Neutrophils Not Estab. % 43  49     Lymphs Not Estab. % 44     41     Monocytes Not Estab. % 10     7     Eos Not Estab. % 2     2     Basos Not Estab. % 1     1     Neutrophils Absolute 1.4 - 7.0 x10E3/uL 3.0     3.2     Lymphocytes Absolute 0.7 - 3.1 x10E3/uL 3.1     2.7     Monocytes Absolute 0.1 - 0.9 x10E3/uL 0.7     0.5     EOS (ABSOLUTE) 0.0 - 0.4 x10E3/uL 0.1     0.2     Basophils Absolute 0.0 - 0.2 x10E3/uL 0.1     0.1     Immature Granulocytes Not Estab. % 0     0     Immature Grans             _______________________________________   ____________________________________________       ____________________________________________   INITIAL IMPRESSION / ASSESSMENT AND PLAN  As part of my medical decision making, I reviewed the following data  within the [place]       Discussed no acute findings on lab results with patient.    ____________________________________________   FINAL CLINICAL IMPRESSION  Well exam   ED Discharge Orders     None        Note:  This document was prepared using Dragon voice recognition software and may include unintentional dictation errors.

## 2021-11-13 DIAGNOSIS — F411 Generalized anxiety disorder: Secondary | ICD-10-CM | POA: Diagnosis not present

## 2021-11-13 DIAGNOSIS — R69 Illness, unspecified: Secondary | ICD-10-CM | POA: Diagnosis not present

## 2021-11-13 DIAGNOSIS — F429 Obsessive-compulsive disorder, unspecified: Secondary | ICD-10-CM | POA: Diagnosis not present

## 2021-11-13 DIAGNOSIS — F5105 Insomnia due to other mental disorder: Secondary | ICD-10-CM | POA: Diagnosis not present

## 2021-11-20 DIAGNOSIS — R69 Illness, unspecified: Secondary | ICD-10-CM | POA: Diagnosis not present

## 2021-12-09 DIAGNOSIS — F5105 Insomnia due to other mental disorder: Secondary | ICD-10-CM | POA: Diagnosis not present

## 2021-12-09 DIAGNOSIS — R69 Illness, unspecified: Secondary | ICD-10-CM | POA: Diagnosis not present

## 2021-12-09 DIAGNOSIS — F429 Obsessive-compulsive disorder, unspecified: Secondary | ICD-10-CM | POA: Diagnosis not present

## 2021-12-09 DIAGNOSIS — F411 Generalized anxiety disorder: Secondary | ICD-10-CM | POA: Diagnosis not present

## 2021-12-10 DIAGNOSIS — R69 Illness, unspecified: Secondary | ICD-10-CM | POA: Diagnosis not present

## 2021-12-10 DIAGNOSIS — F411 Generalized anxiety disorder: Secondary | ICD-10-CM | POA: Diagnosis not present

## 2021-12-26 DIAGNOSIS — N898 Other specified noninflammatory disorders of vagina: Secondary | ICD-10-CM | POA: Diagnosis not present

## 2022-02-04 DIAGNOSIS — R69 Illness, unspecified: Secondary | ICD-10-CM | POA: Diagnosis not present

## 2022-02-04 DIAGNOSIS — Q341 Congenital cyst of mediastinum: Secondary | ICD-10-CM | POA: Diagnosis not present

## 2022-02-04 DIAGNOSIS — Z8679 Personal history of other diseases of the circulatory system: Secondary | ICD-10-CM | POA: Diagnosis not present

## 2022-02-04 DIAGNOSIS — R002 Palpitations: Secondary | ICD-10-CM | POA: Diagnosis not present

## 2022-02-04 DIAGNOSIS — I4719 Other supraventricular tachycardia: Secondary | ICD-10-CM | POA: Diagnosis not present

## 2022-02-04 DIAGNOSIS — I493 Ventricular premature depolarization: Secondary | ICD-10-CM | POA: Diagnosis not present

## 2022-02-11 DIAGNOSIS — R002 Palpitations: Secondary | ICD-10-CM | POA: Diagnosis not present

## 2022-02-11 DIAGNOSIS — Q341 Congenital cyst of mediastinum: Secondary | ICD-10-CM | POA: Diagnosis not present

## 2022-02-11 DIAGNOSIS — I493 Ventricular premature depolarization: Secondary | ICD-10-CM | POA: Diagnosis not present

## 2022-02-11 DIAGNOSIS — Z8679 Personal history of other diseases of the circulatory system: Secondary | ICD-10-CM | POA: Diagnosis not present

## 2022-02-11 DIAGNOSIS — I4719 Other supraventricular tachycardia: Secondary | ICD-10-CM | POA: Diagnosis not present

## 2022-02-13 DIAGNOSIS — R69 Illness, unspecified: Secondary | ICD-10-CM | POA: Diagnosis not present

## 2022-02-13 DIAGNOSIS — F429 Obsessive-compulsive disorder, unspecified: Secondary | ICD-10-CM | POA: Diagnosis not present

## 2022-02-13 DIAGNOSIS — F411 Generalized anxiety disorder: Secondary | ICD-10-CM | POA: Diagnosis not present

## 2022-02-13 DIAGNOSIS — F5105 Insomnia due to other mental disorder: Secondary | ICD-10-CM | POA: Diagnosis not present

## 2022-03-02 ENCOUNTER — Other Ambulatory Visit (HOSPITAL_COMMUNITY)
Admission: RE | Admit: 2022-03-02 | Discharge: 2022-03-02 | Disposition: A | Discharge status: Home / Self Care | Payer: 59 | Source: Ambulatory Visit | Attending: Obstetrics and Gynecology | Admitting: Obstetrics and Gynecology

## 2022-03-02 ENCOUNTER — Ambulatory Visit (INDEPENDENT_AMBULATORY_CARE_PROVIDER_SITE_OTHER): Payer: 59

## 2022-03-02 VITALS — BP 99/68 | HR 92 | Ht 68.0 in | Wt 141.4 lb

## 2022-03-02 DIAGNOSIS — N898 Other specified noninflammatory disorders of vagina: Secondary | ICD-10-CM | POA: Insufficient documentation

## 2022-03-02 DIAGNOSIS — Z113 Encounter for screening for infections with a predominantly sexual mode of transmission: Secondary | ICD-10-CM

## 2022-03-02 DIAGNOSIS — R69 Illness, unspecified: Secondary | ICD-10-CM | POA: Diagnosis not present

## 2022-03-02 NOTE — Addendum Note (Signed)
Addended by: Georgiana Shore R on: 03/02/2022 02:30 PM   Modules accepted: Orders

## 2022-03-02 NOTE — Progress Notes (Signed)
Patient presents today for STD testing via self swab culture and labwork. She states possible STD exposure. Symptoms include vaginal discharge, odor, itching and occasional rash starting about 1-2 months ago. Self swab culture preformed. All questions asked, patient aware office will be in touch with results

## 2022-03-03 LAB — HSV 1 AND 2 AB, IGG
HSV 1 Glycoprotein G Ab, IgG: <0.91 index (ref 0.00–0.90)
HSV 2 IgG, Type Spec: <0.91 index (ref 0.00–0.90)

## 2022-03-03 LAB — HEPATITIS C ANTIBODY: Hep C Virus Ab: NONREACTIVE

## 2022-03-03 LAB — HIV ANTIBODY (ROUTINE TESTING W REFLEX): HIV Screen 4th Generation wRfx: NONREACTIVE

## 2022-03-03 LAB — RPR: RPR Ser Ql: NONREACTIVE

## 2022-03-03 LAB — HEPATITIS B SURFACE ANTIGEN: Hepatitis B Surface Ag: NEGATIVE

## 2022-03-04 ENCOUNTER — Other Ambulatory Visit: Payer: Self-pay

## 2022-03-04 ENCOUNTER — Telehealth: Payer: Self-pay

## 2022-03-04 DIAGNOSIS — B9689 Other specified bacterial agents as the cause of diseases classified elsewhere: Secondary | ICD-10-CM

## 2022-03-04 LAB — CERVICOVAGINAL ANCILLARY ONLY
Bacterial Vaginitis (gardnerella): POSITIVE — AB
Candida Glabrata: NEGATIVE
Candida Vaginitis: NEGATIVE
Chlamydia: NEGATIVE
Comment: NEGATIVE
Comment: NEGATIVE
Comment: NEGATIVE
Comment: NEGATIVE
Comment: NEGATIVE
Comment: NORMAL
Neisseria Gonorrhea: NEGATIVE
Trichomonas: NEGATIVE

## 2022-03-04 MED ORDER — METRONIDAZOLE 500 MG PO TABS: 500.0000 mg | ORAL_TABLET | Freq: Two times a day (BID) | ORAL | 0 refills | Status: DC

## 2022-03-04 NOTE — Telephone Encounter (Signed)
Pt came in our office saying she saw her positive BV results, she is requesting a Rx. Flagyl sent, pt aware.

## 2022-03-05 ENCOUNTER — Other Ambulatory Visit: Payer: Self-pay | Admitting: Obstetrics and Gynecology

## 2022-03-05 DIAGNOSIS — F429 Obsessive-compulsive disorder, unspecified: Secondary | ICD-10-CM | POA: Diagnosis not present

## 2022-03-05 DIAGNOSIS — F5105 Insomnia due to other mental disorder: Secondary | ICD-10-CM | POA: Diagnosis not present

## 2022-03-05 DIAGNOSIS — F411 Generalized anxiety disorder: Secondary | ICD-10-CM | POA: Diagnosis not present

## 2022-03-05 DIAGNOSIS — R69 Illness, unspecified: Secondary | ICD-10-CM | POA: Diagnosis not present

## 2022-05-01 DIAGNOSIS — F5105 Insomnia due to other mental disorder: Secondary | ICD-10-CM | POA: Diagnosis not present

## 2022-05-01 DIAGNOSIS — F429 Obsessive-compulsive disorder, unspecified: Secondary | ICD-10-CM | POA: Diagnosis not present

## 2022-05-01 DIAGNOSIS — F411 Generalized anxiety disorder: Secondary | ICD-10-CM | POA: Diagnosis not present

## 2022-05-01 DIAGNOSIS — R69 Illness, unspecified: Secondary | ICD-10-CM | POA: Diagnosis not present

## 2022-06-19 DIAGNOSIS — F411 Generalized anxiety disorder: Secondary | ICD-10-CM | POA: Diagnosis not present

## 2022-06-19 DIAGNOSIS — F39 Unspecified mood [affective] disorder: Secondary | ICD-10-CM | POA: Diagnosis not present

## 2022-06-19 DIAGNOSIS — F5105 Insomnia due to other mental disorder: Secondary | ICD-10-CM | POA: Diagnosis not present

## 2022-06-19 DIAGNOSIS — F429 Obsessive-compulsive disorder, unspecified: Secondary | ICD-10-CM | POA: Diagnosis not present

## 2022-06-24 DIAGNOSIS — F411 Generalized anxiety disorder: Secondary | ICD-10-CM | POA: Diagnosis not present

## 2022-06-26 DIAGNOSIS — M222X2 Patellofemoral disorders, left knee: Secondary | ICD-10-CM | POA: Diagnosis not present

## 2022-06-26 DIAGNOSIS — M222X1 Patellofemoral disorders, right knee: Secondary | ICD-10-CM | POA: Diagnosis not present

## 2022-07-01 DIAGNOSIS — F411 Generalized anxiety disorder: Secondary | ICD-10-CM | POA: Diagnosis not present

## 2022-07-01 DIAGNOSIS — F5105 Insomnia due to other mental disorder: Secondary | ICD-10-CM | POA: Diagnosis not present

## 2022-07-01 DIAGNOSIS — F39 Unspecified mood [affective] disorder: Secondary | ICD-10-CM | POA: Diagnosis not present

## 2022-07-01 DIAGNOSIS — F429 Obsessive-compulsive disorder, unspecified: Secondary | ICD-10-CM | POA: Diagnosis not present

## 2022-07-10 DIAGNOSIS — F411 Generalized anxiety disorder: Secondary | ICD-10-CM | POA: Diagnosis not present

## 2022-07-22 DIAGNOSIS — F411 Generalized anxiety disorder: Secondary | ICD-10-CM | POA: Diagnosis not present

## 2022-07-23 DIAGNOSIS — I4719 Other supraventricular tachycardia: Secondary | ICD-10-CM | POA: Diagnosis not present

## 2022-07-23 DIAGNOSIS — F5105 Insomnia due to other mental disorder: Secondary | ICD-10-CM | POA: Diagnosis not present

## 2022-07-23 DIAGNOSIS — Q341 Congenital cyst of mediastinum: Secondary | ICD-10-CM | POA: Diagnosis not present

## 2022-07-23 DIAGNOSIS — F411 Generalized anxiety disorder: Secondary | ICD-10-CM | POA: Diagnosis not present

## 2022-07-23 DIAGNOSIS — R002 Palpitations: Secondary | ICD-10-CM | POA: Diagnosis not present

## 2022-07-23 DIAGNOSIS — F429 Obsessive-compulsive disorder, unspecified: Secondary | ICD-10-CM | POA: Diagnosis not present

## 2022-07-23 DIAGNOSIS — M222X1 Patellofemoral disorders, right knee: Secondary | ICD-10-CM | POA: Diagnosis not present

## 2022-07-23 DIAGNOSIS — I493 Ventricular premature depolarization: Secondary | ICD-10-CM | POA: Diagnosis not present

## 2022-07-23 DIAGNOSIS — M222X2 Patellofemoral disorders, left knee: Secondary | ICD-10-CM | POA: Diagnosis not present

## 2022-07-23 DIAGNOSIS — F39 Unspecified mood [affective] disorder: Secondary | ICD-10-CM | POA: Diagnosis not present

## 2022-07-28 DIAGNOSIS — F411 Generalized anxiety disorder: Secondary | ICD-10-CM | POA: Diagnosis not present

## 2022-08-03 DIAGNOSIS — M222X2 Patellofemoral disorders, left knee: Secondary | ICD-10-CM | POA: Diagnosis not present

## 2022-08-03 DIAGNOSIS — M222X1 Patellofemoral disorders, right knee: Secondary | ICD-10-CM | POA: Diagnosis not present

## 2022-08-05 DIAGNOSIS — F411 Generalized anxiety disorder: Secondary | ICD-10-CM | POA: Diagnosis not present

## 2022-08-12 DIAGNOSIS — F411 Generalized anxiety disorder: Secondary | ICD-10-CM | POA: Diagnosis not present

## 2022-08-13 DIAGNOSIS — F39 Unspecified mood [affective] disorder: Secondary | ICD-10-CM | POA: Diagnosis not present

## 2022-08-13 DIAGNOSIS — F411 Generalized anxiety disorder: Secondary | ICD-10-CM | POA: Diagnosis not present

## 2022-08-13 DIAGNOSIS — F429 Obsessive-compulsive disorder, unspecified: Secondary | ICD-10-CM | POA: Diagnosis not present

## 2022-08-13 DIAGNOSIS — F5105 Insomnia due to other mental disorder: Secondary | ICD-10-CM | POA: Diagnosis not present

## 2022-08-19 DIAGNOSIS — F411 Generalized anxiety disorder: Secondary | ICD-10-CM | POA: Diagnosis not present

## 2022-08-21 DIAGNOSIS — M222X1 Patellofemoral disorders, right knee: Secondary | ICD-10-CM | POA: Diagnosis not present

## 2022-08-21 DIAGNOSIS — M222X2 Patellofemoral disorders, left knee: Secondary | ICD-10-CM | POA: Diagnosis not present

## 2022-08-24 DIAGNOSIS — I4719 Other supraventricular tachycardia: Secondary | ICD-10-CM | POA: Diagnosis not present

## 2022-08-24 DIAGNOSIS — R002 Palpitations: Secondary | ICD-10-CM | POA: Diagnosis not present

## 2022-08-24 DIAGNOSIS — I493 Ventricular premature depolarization: Secondary | ICD-10-CM | POA: Diagnosis not present

## 2022-08-24 DIAGNOSIS — Q341 Congenital cyst of mediastinum: Secondary | ICD-10-CM | POA: Diagnosis not present

## 2022-08-25 DIAGNOSIS — M222X1 Patellofemoral disorders, right knee: Secondary | ICD-10-CM | POA: Diagnosis not present

## 2022-08-25 DIAGNOSIS — M222X2 Patellofemoral disorders, left knee: Secondary | ICD-10-CM | POA: Diagnosis not present

## 2022-08-26 DIAGNOSIS — F411 Generalized anxiety disorder: Secondary | ICD-10-CM | POA: Diagnosis not present

## 2022-09-01 DIAGNOSIS — M222X2 Patellofemoral disorders, left knee: Secondary | ICD-10-CM | POA: Diagnosis not present

## 2022-09-01 DIAGNOSIS — M222X1 Patellofemoral disorders, right knee: Secondary | ICD-10-CM | POA: Diagnosis not present

## 2022-09-02 DIAGNOSIS — F411 Generalized anxiety disorder: Secondary | ICD-10-CM | POA: Diagnosis not present

## 2022-09-08 DIAGNOSIS — M222X2 Patellofemoral disorders, left knee: Secondary | ICD-10-CM | POA: Diagnosis not present

## 2022-09-08 DIAGNOSIS — M222X1 Patellofemoral disorders, right knee: Secondary | ICD-10-CM | POA: Diagnosis not present

## 2022-09-09 DIAGNOSIS — F411 Generalized anxiety disorder: Secondary | ICD-10-CM | POA: Diagnosis not present

## 2022-09-17 DIAGNOSIS — F411 Generalized anxiety disorder: Secondary | ICD-10-CM | POA: Diagnosis not present

## 2022-09-24 DIAGNOSIS — F411 Generalized anxiety disorder: Secondary | ICD-10-CM | POA: Diagnosis not present

## 2022-09-25 DIAGNOSIS — Z113 Encounter for screening for infections with a predominantly sexual mode of transmission: Secondary | ICD-10-CM | POA: Diagnosis not present

## 2022-09-25 DIAGNOSIS — Z114 Encounter for screening for human immunodeficiency virus [HIV]: Secondary | ICD-10-CM | POA: Diagnosis not present

## 2022-09-25 DIAGNOSIS — K1370 Unspecified lesions of oral mucosa: Secondary | ICD-10-CM | POA: Diagnosis not present

## 2022-09-25 DIAGNOSIS — J029 Acute pharyngitis, unspecified: Secondary | ICD-10-CM | POA: Diagnosis not present

## 2022-09-29 DIAGNOSIS — Z113 Encounter for screening for infections with a predominantly sexual mode of transmission: Secondary | ICD-10-CM | POA: Diagnosis not present

## 2022-09-30 DIAGNOSIS — F411 Generalized anxiety disorder: Secondary | ICD-10-CM | POA: Diagnosis not present

## 2022-10-01 DIAGNOSIS — F411 Generalized anxiety disorder: Secondary | ICD-10-CM | POA: Diagnosis not present

## 2022-10-01 DIAGNOSIS — F39 Unspecified mood [affective] disorder: Secondary | ICD-10-CM | POA: Diagnosis not present

## 2022-10-01 DIAGNOSIS — F5105 Insomnia due to other mental disorder: Secondary | ICD-10-CM | POA: Diagnosis not present

## 2022-10-01 DIAGNOSIS — F429 Obsessive-compulsive disorder, unspecified: Secondary | ICD-10-CM | POA: Diagnosis not present

## 2023-03-01 ENCOUNTER — Other Ambulatory Visit (HOSPITAL_COMMUNITY)
Admission: RE | Admit: 2023-03-01 | Discharge: 2023-03-01 | Disposition: A | Discharge status: Home / Self Care | Payer: BC Managed Care – PPO | Source: Ambulatory Visit | Attending: Obstetrics | Admitting: Obstetrics

## 2023-03-01 ENCOUNTER — Ambulatory Visit (INDEPENDENT_AMBULATORY_CARE_PROVIDER_SITE_OTHER): Payer: Self-pay | Admitting: Obstetrics

## 2023-03-01 VITALS — BP 92/71 | HR 79 | Ht 68.0 in | Wt 136.4 lb

## 2023-03-01 DIAGNOSIS — R102 Pelvic and perineal pain: Secondary | ICD-10-CM | POA: Diagnosis present

## 2023-03-01 LAB — POCT URINALYSIS DIPSTICK OB
Bilirubin, UA: NEGATIVE
Blood, UA: NEGATIVE
Glucose, UA: NEGATIVE
Ketones, UA: NEGATIVE
Leukocytes, UA: NEGATIVE
Nitrite, UA: NEGATIVE
Spec Grav, UA: 1.01 (ref 1.010–1.025)
Urobilinogen, UA: 0.2 U/dL
pH, UA: 6.5 (ref 5.0–8.0)

## 2023-03-01 NOTE — Progress Notes (Signed)
    GYNECOLOGY PROGRESS NOTE  Subjective:    Patient ID: Sherry Clayton, female    DOB: March 27, 2000, 22 y.o.   MRN: [id number]  HPI  Patient is a 22 y.o. No obstetric history on file. female who presents for evaluation of pelvic pain. Patient's last menstrual period was 02/19/2023 (exact date). She has been having cramping feeling that is severe, she is unable to sit or lay down when she has this pain. Onset of pain x 1 month, it comes and goes. She reports that pain radiates to her ribs. Feels like pins and needles around her umbilicus. She reports she was recently screened for STD's. She has a history of UTI's but this pain does not mimic a UTI.   The following portions of the patient's history were reviewed and updated as appropriate: allergies, current medications, past family history, past medical history, past social history, past surgical history, and problem list.  Review of Systems Pertinent items noted in HPI and remainder of comprehensive ROS otherwise negative.   Objective:   Height 5\' 8"  (1.727 m), weight 136 lb 6.4 oz (61.9 kg). Body mass index is 20.74 kg/m. General appearance: alert, cooperative, and no distress Abdomen: soft, non-tender; bowel sounds normal; no masses,  no organomegaly Pelvic: cervix normal in appearance, external genitalia normal, no cervical motion tenderness, positive findings: she has moderate adnexal tenderness on her right side. No obvious adnexal mass palpated. , rectovaginal septum normal, uterus normal size, shape, and consistency, and there is a slight vaginal malodor. Aptima swab sent. Extremities: extremities normal, atraumatic, no cyanosis or edema Neurologic: Grossly normal   Assessment:   1. Pelvic pain    Her urine looks "clean" today. She ahs had a renal xray recently that was negative. I have ordered a pelvic scan for her. Aptima swab sent to check for yeast and or BV.  Plan:  We will follow up based on her scan .  I have discussed  birth control again with her today. She I uses only condoms. Described the Ring, OCPs and Nexplanon. She is afraid of weight gain with any hormonal method. She may be interested in using the Ring in the future.  Recent STI screening was negative per her report. There are no diagnoses linked to this encounter.     Hildred Laser, MD [facility]

## 2023-03-01 NOTE — Progress Notes (Deleted)
Gynecology Pelvic Pain Evaluation   Chief Complaint:  Chief Complaint  Patient presents with  . Pelvic Pain    History of Present Illness:   Patient is a 22 y.o. No obstetric history on file. who LMP was Patient's last menstrual period was 02/19/2023 (exact date)., presents today for a problem visit.  She complains of pain.this comes and oges, and has worsened over the course of the month. She indicates that is is located bilaterally  from the outer quadrants of her abdomen and extends around to her back. She was seen recently by Urology and they did an x ray and told her they did not see kidney stones. She does have a hx of a stone per her report.  Rosann is partnered and sexually active. She shares that recent STI testing was negative.she denies dysuria, her periods are regular. She has been offered hormonal birth control in the past and declined. She is afraind of weight gain with hormonal methods.  Her pain is localized to the deep pelvis and and adnexal areas per exam today  area, described as intermittent, began about a month ago and its severity is described as moderate. The pain radiates to the  Non-radiating. She has these associated symptoms which include nothing. Patient has these modifiers which include nothing that make it better and bending over that make it worse.   Previous evaluation: Urology consult. Prior Diagnosis:  no identified etiology . Previous Treatment: {prev rx:318706}.  Review of Systems: ROS  Past Medical History:  Patient Active Problem List   Diagnosis Date Noted Date Diagnosed  . Vaginal discharge 10/31/2020   . Mood disorder ([facility]) 01/19/2019   . GAD (generalized anxiety disorder) 06/30/2018     Past Surgical History:  No past surgical history on file.  Gynecologic History:  Patient's last menstrual period was 02/19/2023 (exact date). She is {sex active: 315163}.  Contraception: {method:5051}. Hx of STDs: {Diagnoses; std:14028}. History of  domestic or sexual abuse:  {yes/no:63} Last Pap: ***. Results were: {norm/abn:16337} Menarche:{numbers 1-61:09604} Menstrual Interval: {numbers 22-35:14824}  days Duration of flow: {numbers; 0-10:33138} days Heavy Menses: {yes/no:63} Clots: {yes/no:63} Intermenstrual Bleeding: {yes/no:63} Postcoital Bleeding: {yes/no:63} Dysmenorrhea: {yes/no:63}  Obstetric History: No obstetric history on file.  Family History:  Family History  Problem Relation Age of Onset  . Anxiety disorder Mother   . Depression Mother     Social History:  Social History   Socioeconomic History  . Marital status: Single    Spouse name: Not on file  . Number of children: Not on file  . Years of education: Not on file  . Highest education level: Not on file  Occupational History  . Not on file  Tobacco Use  . Smoking status: Never  . Smokeless tobacco: Never  Vaping Use  . Vaping status: Never Used  Substance and Sexual Activity  . Alcohol use: No  . Drug use: No  . Sexual activity: Yes  Other Topics Concern  . Not on file  Social History Narrative  . Not on file   Social Drivers of Health   Financial Resource Strain: Not on file  Food Insecurity: Not on file  Transportation Needs: Not on file  Physical Activity: Not on file  Stress: Not on file  Social Connections: Not on file  Intimate Partner Violence: Not on file    Allergies:  Allergies  Allergen Reactions  . Sulfa Antibiotics Rash    Medications: Prior to Admission medications   Medication Sig Start Date  End Date Taking? Authorizing Provider  FLUoxetine (PROZAC) 20 MG capsule Take 20 mg by mouth daily. 09/23/20  Yes [provider]  lamoTRIgine (LAMICTAL) 150 MG tablet  11/19/19  Yes [provider]  metoprolol succinate (TOPROL-XL) 25 MG 24 hr tablet 12.5 mg daily. 02/04/22  Yes [provider]  QUEtiapine (SEROQUEL) 50 MG tablet Take 50 mg by mouth at bedtime. 01/15/21  Yes [provider]     Physical Exam Vitals: Height 5\' 8"  (1.727 m), weight 136 lb 6.4 oz (61.9 kg), last menstrual period 02/19/2023.  General: NAD HEENT: normocephalic, anicteric Pulmonary: No increased work of breathing Abdomen: NABS, soft, non-tender, non-distended.  Umbilicus without lesions.  No hepatomegaly, splenomegaly or masses palpable. No evidence of hernia  Genitourinary:  External: Normal external female genitalia.  Normal urethral meatus, normal  Bartholin's and Skene's glands.    Vagina: Normal vaginal mucosa, no evidence of prolapse.    Cervix: Grossly normal in appearance, no bleeding  Uterus: Non-enlarged, mobile, normal contour.  No CMT  Adnexa: ovaries non-enlarged, no adnexal masses  Rectal: deferred  Lymphatic: no evidence of inguinal lymphadenopathy Extremities: no edema, erythema, or tenderness Neurologic: Grossly intact Psychiatric: mood appropriate, affect full  Female chaperone present for pelvic portion of the physical exam  Assessment: 22 y.o. No obstetric history on file. with {diagnoses; pain lower abd:12503}.  Problem List Items Addressed This Visit   None Visit Diagnoses       Pelvic pain    -  Primary        1) We discussed the possible etiologies for pelvic pain in women.  Gynecologic causes may include endometriosis, adenomyosis, pelvic inflammatory disease (PID), ovarian cysts, ovarian or tubal torsion, and in rare case gynecologic malignancy such as cervical, uterine, or ovarian cancer.  In addition thee possibility of non-gynecologic etiologies such as urinary or GI tract pathology or disordered, as well as musculoskeletal problems.  The goal is to complete a basic work up in hopes of identifying the underlying cause which in turn will dictate treatment.  In the meantime supportive measures such as localized heat, and NSAIDs are reasonable first steps.     - Prescription drug database {WAS/WAS NOT:[phone]::"was not"} reviewed, UDS {WAS/WAS  NOT:[phone]::"was not"} ordered - Transvaginal ultrasound {Blank single:19197::"reviewed","ordered","not ordered but prior imaging CT/MRI reviewed","***"} - Blood work obtained today {yes/no:20286}  - Cervical cultures {yes/no:20286} - UA {Blank single:19197::"reviewed","ordered and reviwed","***"}  2) No follow-ups on file.   Mirna Mires, CNM  03/01/2023 10:06 AM   03/01/2023, 10:06 AM

## 2023-03-03 ENCOUNTER — Other Ambulatory Visit: Payer: Self-pay | Admitting: Obstetrics

## 2023-03-03 ENCOUNTER — Encounter: Payer: Self-pay | Admitting: Obstetrics

## 2023-03-03 DIAGNOSIS — N760 Acute vaginitis: Secondary | ICD-10-CM

## 2023-03-03 LAB — CERVICOVAGINAL ANCILLARY ONLY
Bacterial Vaginitis (gardnerella): POSITIVE — AB
Candida Glabrata: NEGATIVE
Candida Vaginitis: NEGATIVE
Comment: NEGATIVE
Comment: NEGATIVE
Comment: NEGATIVE

## 2023-03-03 MED ORDER — METRONIDAZOLE 500 MG PO TABS: 500.0000 mg | ORAL_TABLET | Freq: Two times a day (BID) | ORAL | 0 refills | Status: AC

## 2023-03-08 ENCOUNTER — Ambulatory Visit
Admission: RE | Admit: 2023-03-08 | Discharge: 2023-03-08 | Disposition: A | Discharge status: Home / Self Care | Payer: BC Managed Care – PPO | Source: Ambulatory Visit | Attending: Obstetrics | Admitting: Obstetrics

## 2023-03-08 ENCOUNTER — Other Ambulatory Visit: Payer: Self-pay | Admitting: Obstetrics

## 2023-03-08 DIAGNOSIS — R102 Pelvic and perineal pain: Secondary | ICD-10-CM

## 2023-03-12 ENCOUNTER — Encounter: Payer: Self-pay | Admitting: Obstetrics

## 2023-03-12 ENCOUNTER — Ambulatory Visit: Payer: 59 | Admitting: Urology

## 2023-12-08 ENCOUNTER — Encounter: Payer: Self-pay | Admitting: Emergency Medicine

## 2023-12-08 ENCOUNTER — Ambulatory Visit
Admission: EM | Admit: 2023-12-08 | Discharge: 2023-12-08 | Disposition: A | Discharge status: Home / Self Care | Attending: Emergency Medicine | Admitting: Emergency Medicine

## 2023-12-08 DIAGNOSIS — S01339A Puncture wound without foreign body of unspecified ear, initial encounter: Secondary | ICD-10-CM | POA: Diagnosis not present

## 2023-12-08 DIAGNOSIS — H6013 Cellulitis of external ear, bilateral: Secondary | ICD-10-CM | POA: Diagnosis not present

## 2023-12-08 MED ORDER — MUPIROCIN 2 % EX OINT: 1.0000 | TOPICAL_OINTMENT | Freq: Two times a day (BID) | CUTANEOUS | 0 refills | Status: AC

## 2023-12-08 MED ORDER — DOXYCYCLINE HYCLATE 100 MG PO CAPS: 100.0000 mg | ORAL_CAPSULE | Freq: Two times a day (BID) | ORAL | 0 refills | Status: AC

## 2023-12-08 NOTE — ED Provider Notes (Signed)
 Sherry Clayton    CSN: [id number] Arrival date & time: 12/08/23  1826      History   Chief Complaint Chief Complaint  Patient presents with   Otalgia    HPI Sherry Clayton is a 23 y.o. female.  Patient presents with bilateral external ear redness and pain, worse on the left.  Her symptoms started after she self-pierced 6 holes in her left ear and 5 holes in her right ear.  She states some of the wounds have purulent drainage.  She has not taken any OTC medications or attempted any treatment.  She has not been cleaning the wounds or turning the earrings.  The history is provided by the patient and medical records.    Past Medical History:  Diagnosis Date   ADHD (attention deficit hyperactivity disorder)    Anxiety    Depression     Patient Active Problem List   Diagnosis Date Noted   Vaginal discharge 10/31/2020   Mood disorder 01/19/2019   GAD (generalized anxiety disorder) 06/30/2018    History reviewed. No pertinent surgical history.  OB History   No obstetric history on file.      Home Medications    Prior to Admission medications   Medication Sig Start Date End Date Taking? Authorizing Provider  doxycycline  (VIBRAMYCIN ) 100 MG capsule Take 1 capsule (100 mg total) by mouth 2 (two) times daily for 7 days. 12/08/23 12/15/23 Yes Corlis Burnard DEL, NP  mupirocin  ointment (BACTROBAN ) 2 % Apply 1 Application topically 2 (two) times daily. 12/08/23  Yes Corlis Burnard DEL, NP  FLUoxetine  (PROZAC ) 20 MG capsule Take 20 mg by mouth daily. 09/23/20   [provider]  lamoTRIgine  (LAMICTAL ) 150 MG tablet  11/19/19   [provider]  metoprolol succinate (TOPROL-XL) 25 MG 24 hr tablet 12.5 mg daily. 02/04/22   [provider]  QUEtiapine  (SEROQUEL ) 50 MG tablet Take 50 mg by mouth at bedtime. 01/15/21   [provider]    Family History Family History  Problem Relation Age of Onset   Anxiety disorder Mother    Depression Mother      Social History Social History   Tobacco Use   Smoking status: Never   Smokeless tobacco: Never  Vaping Use   Vaping status: Never Used  Substance Use Topics   Alcohol use: Yes   Drug use: No     Allergies   Sulfa antibiotics   Review of Systems Review of Systems  Constitutional:  Negative for chills and fever.  Skin:  Positive for color change and wound.     Physical Exam Triage Vital Signs ED Triage Vitals  Encounter Vitals Group     BP 12/08/23 1936 112/76     Girls Systolic BP Percentile --      Girls Diastolic BP Percentile --      Boys Systolic BP Percentile --      Boys Diastolic BP Percentile --      Pulse Rate 12/08/23 1936 84     Resp 12/08/23 1936 18     Temp 12/08/23 1936 98.2 F (36.8 C)     Temp src --      SpO2 12/08/23 1936 99 %     Weight --      Height --      Head Circumference --      Peak Flow --      Pain Score 12/08/23 1940 3     Pain Loc --  Pain Education --      Exclude from Growth Chart --    No data found.  Updated Vital Signs BP 112/76   Pulse 84   Temp 98.2 F (36.8 C)   Resp 18   LMP 12/03/2023 (Exact Date)   SpO2 99%   Visual Acuity Right Eye Distance:   Left Eye Distance:   Bilateral Distance:    Right Eye Near:   Left Eye Near:    Bilateral Near:     Physical Exam Constitutional:      General: She is not in acute distress. HENT:     Right Ear: Tympanic membrane normal.     Left Ear: Tympanic membrane normal.     Ears:     Comments: Both external ears erythematous and edematous, L>R.  6 piercings in left ear and 5 piercings in right ear.  All of these piercings are crusted with dried blood and are tender to palpation.    Nose: Nose normal.     Mouth/Throat:     Mouth: Mucous membranes are moist.     Pharynx: Oropharynx is clear.  Cardiovascular:     Rate and Rhythm: Normal rate and regular rhythm.     Heart sounds: Normal heart sounds.  Pulmonary:     Effort: Pulmonary effort is normal. No  respiratory distress.     Breath sounds: Normal breath sounds.  Neurological:     Mental Status: She is alert.      UC Treatments / Results  Labs (all labs ordered are listed, but only abnormal results are displayed) Labs Reviewed - No data to display  EKG   Radiology No results found.  Procedures Procedures (including critical care time)  Medications Ordered in UC Medications - No data to display  Initial Impression / Assessment and Plan / UC Course  I have reviewed the triage vital signs and the nursing notes.  Pertinent labs & imaging results that were available during my care of the patient were reviewed by me and considered in my medical decision making (see chart for details).    Cellulitis of external ears on both sides, complication of ear piercing.  Patient self-pierced 6 holes in her left ear and 5 in her right ear.  Instructed patient to remove all of the earrings from her ears to allow proper healing.  Treating today with doxycycline  and mupirocin  ointment.  Wound care instructions discussed.  Education provided on cellulitis.  ED precautions given.  Instructed patient to follow-up with her PCP.  She agrees to plan of care.  Final Clinical Impressions(s) / UC Diagnoses   Final diagnoses:  Cellulitis of external ear, bilateral  Complication of ear piercing, unspecified laterality, initial encounter     Discharge Instructions      Take all of the earrings out.  Wash the areas with soap and water twice a day.  Then apply the mupirocin  ointment twice a day.  Take the doxycycline  as directed.  Follow-up with your primary care provider.  Go to the emergency department if you have worsening symptoms.     ED Prescriptions     Medication Sig Dispense Auth. Provider   mupirocin  ointment (BACTROBAN ) 2 % Apply 1 Application topically 2 (two) times daily. 22 g Corlis Burnard DEL, NP   doxycycline  (VIBRAMYCIN ) 100 MG capsule Take 1 capsule (100 mg total) by mouth 2 (two)  times daily for 7 days. 14 capsule Corlis Burnard DEL, NP      PDMP not reviewed this  encounter.   Corlis Burnard DEL, NP 12/08/23 2000

## 2023-12-08 NOTE — Discharge Instructions (Addendum)
 Take all of the earrings out.  Wash the areas with soap and water twice a day.  Then apply the mupirocin  ointment twice a day.  Take the doxycycline  as directed.  Follow-up with your primary care provider.  Go to the emergency department if you have worsening symptoms.

## 2023-12-08 NOTE — ED Triage Notes (Signed)
 Patient reports bilateral outer ear pain and redness after having ear pierce 3 days ago. Patient has not taken anything for symptoms. Rates pain 3/10.
